# Patient Record
Sex: Female | Born: 1980 | Race: White | Hispanic: No | Marital: Single | Smoking: Current every day smoker
Health system: Southern US, Community
[De-identification: ages and names within clinical notes are randomized; demographics above are authoritative.]

## PROBLEM LIST (undated history)

## (undated) DIAGNOSIS — F419 Anxiety disorder, unspecified: Secondary | ICD-10-CM

## (undated) DIAGNOSIS — F32A Depression, unspecified: Secondary | ICD-10-CM

## (undated) DIAGNOSIS — R87619 Unspecified abnormal cytological findings in specimens from cervix uteri: Secondary | ICD-10-CM

## (undated) DIAGNOSIS — IMO0002 Reserved for concepts with insufficient information to code with codable children: Secondary | ICD-10-CM

## (undated) DIAGNOSIS — F329 Major depressive disorder, single episode, unspecified: Secondary | ICD-10-CM

## (undated) HISTORY — DX: Anxiety disorder, unspecified: F41.9

## (undated) HISTORY — PX: NO PAST SURGERIES: SHX2092

## (undated) HISTORY — DX: Unspecified abnormal cytological findings in specimens from cervix uteri: R87.619

## (undated) HISTORY — PX: TUBAL LIGATION: SHX77

## (undated) HISTORY — DX: Reserved for concepts with insufficient information to code with codable children: IMO0002

## (undated) HISTORY — DX: Depression, unspecified: F32.A

## (undated) HISTORY — DX: Major depressive disorder, single episode, unspecified: F32.9

---

## 2000-10-27 ENCOUNTER — Other Ambulatory Visit: Admission: RE | Admit: 2000-10-27 | Discharge: 2000-10-27 | Payer: Self-pay

## 2001-06-30 ENCOUNTER — Other Ambulatory Visit: Admission: RE | Admit: 2001-06-30 | Discharge: 2001-06-30 | Payer: Self-pay | Admitting: Obstetrics and Gynecology

## 2001-10-27 ENCOUNTER — Observation Stay (HOSPITAL_COMMUNITY): Admission: AD | Admit: 2001-10-27 | Discharge: 2001-10-28 | Payer: Self-pay | Admitting: Obstetrics and Gynecology

## 2001-10-29 ENCOUNTER — Ambulatory Visit (HOSPITAL_COMMUNITY): Admission: AD | Admit: 2001-10-29 | Discharge: 2001-10-29 | Payer: Self-pay | Admitting: Obstetrics and Gynecology

## 2001-12-07 ENCOUNTER — Encounter: Payer: Self-pay | Admitting: *Deleted

## 2001-12-07 ENCOUNTER — Inpatient Hospital Stay (HOSPITAL_COMMUNITY): Admission: AD | Admit: 2001-12-07 | Discharge: 2001-12-11 | Payer: Self-pay | Admitting: *Deleted

## 2002-07-15 ENCOUNTER — Ambulatory Visit (HOSPITAL_COMMUNITY): Admission: RE | Admit: 2002-07-15 | Discharge: 2002-07-15 | Payer: Self-pay | Admitting: Obstetrics and Gynecology

## 2009-04-25 ENCOUNTER — Other Ambulatory Visit: Admission: RE | Admit: 2009-04-25 | Discharge: 2009-04-25 | Payer: Self-pay | Admitting: Obstetrics & Gynecology

## 2010-07-08 ENCOUNTER — Other Ambulatory Visit (HOSPITAL_COMMUNITY)
Admission: RE | Admit: 2010-07-08 | Discharge: 2010-07-08 | Disposition: A | Discharge status: Home / Self Care | Payer: Self-pay | Source: Ambulatory Visit | Attending: Obstetrics & Gynecology | Admitting: Obstetrics & Gynecology

## 2010-07-08 DIAGNOSIS — Z01419 Encounter for gynecological examination (general) (routine) without abnormal findings: Secondary | ICD-10-CM | POA: Insufficient documentation

## 2010-09-27 NOTE — Consult Note (Signed)
[facility]  Patient:    Tina Roth, Tina Roth Visit Number: [id number] MRN: [id number]          Service Type: OBS Location: LDR LDR2 01 Attending Physician:  Tilda Burrow Dictated by:   Christin Bach, M.D. Proc. Date: 10/29/01 Admit Date:  10/29/2001 Discharge Date: 10/29/2001                            Consultation Report  CHIEF COMPLAINT:  Decreased fetal heart rate noted on auscultation in office, history of preterm labor symptoms.  HISTORY OF PRESENT ILLNESS:  This 30 year old female gravida 3, para 2-0-0-2 due January 16, 2002 placing her at 28 weeks 5 days having been seen earlier this week for preterm labor symptoms and discharged on betamethasone 12 mg IM q.24h. x2 was seen in our office with second betamethasone dose.  Routine auscultation showed a notable deceleration to approximately 80 lasting only seconds.  This happened three times according to Chilton Si, evaluating nurse midwife.  She was sent to labor and delivery where fetal monitoring is performed and ultrasound for fluid volume.  NST is performed which shows a reactive NST with only occasional brief dips in the heart rate to approximately 100-110 range not considered representative of frank deceleration, but consistent with normal variability.  Ultrasound is performed to rule out oligohydramnios or nuchal cord.  The infant is found to be a singleton fetus vertex presentation with the head in a hyperextended position with no evidence of nuchal cord.  Amniotic fluid index shows an AFI of 18.2. Fetal movement is noted.  Normal heartbeat is noted.  There are no contractions.  Interestingly, urinalysis now returns positive for esterase, nitrates, and red cells with numerous white cells present.  IMPRESSION: 1. Urinary tract infection. 2. Uterine irritability secondary to urinary tract infection. 3. No evidence of fetal distress at this time.  PLAN:  Prescription given  Macrobid 100 mg b.i.d. x7 days.  Follow-up kick counts advised to patient.  Follow up as per routine schedule in our office in one week. Dictated by:   Christin Bach, M.D. Attending Physician:  Tilda Burrow DD:  10/29/01 TD:  10/31/01 Job: [id number]

## 2010-09-27 NOTE — Op Note (Signed)
NAME:  Tina Roth, BAU                         ACCOUNT NO.:  [id number]   MEDICAL RECORD NO.:  [id number]                   PATIENT TYPE:  AMB   LOCATION:  DAY                                  FACILITY:  [facility]   PHYSICIAN:  Tilda Burrow, M.D.              DATE OF BIRTH:  1981-03-08   DATE OF PROCEDURE:  07/15/2002  DATE OF DISCHARGE:                                 OPERATIVE REPORT   PREOPERATIVE DIAGNOSIS:  Elective sterilization.   POSTOPERATIVE DIAGNOSIS:  Elective sterilization.   PROCEDURE:  Laparoscopic tubal sterilization with Falope rings.   SURGEON:  Christin Bach, M.D.   ASSISTANT:  None.   ANESTHESIA:  General.   COMPLICATIONS:  None.   FINDINGS:  Normal tubes and ovaries.  Moderate pelvic relaxation.  No  adhesions or evident gross physical abnormalities noted.   INDICATION:  Elective permanent sterilization.   DETAILS OF PROCEDURE:  The patient was taken to the operating room, prepped  and  draped for a combined abdominal and vaginal procedure, with Hulka  tenaculum attached to the cervix for uterine  manipulation. Bladder in-and-  out catheterization. An infraumbilical, 1 cm vertical incision, as well as a  transverse suprapubic 1 cm incision. Veress needle was used to introduce  pneumoperitoneum through the umbilical incision with the pneumoperitoneum  easily introduced under 10 mmHg of pressure. Introduction of the Veress  needle was done, carefully elevating the abdominal wall and orienting the  needle toward the pelvis.   The laparoscopic trocar was then carefully introduced into the abdomen using  a similar technique, and the laparoscope was used to visualize normal pelvic  anatomy with no evidence of bleeding or trauma. The suprapubic trocar was  introduced under direct visualization, and then attention was directed to  the left fallopian tube, which was identified up to its fimbriated end,  elevated and a mid-segment loop of the tube was drawn up  into the Falope  ring applier, Marcaine 0.25% applied to the surface of the tube and the  Falope ring applied, inspected, and found to be in satisfactory position.  The opposite tube was then treated in a similar fashion. The mesosalpinx  beneath the Falope ring on each side was then infiltrated with approximately  3 cc of Marcaine 0.25%, using a transabdominal approach with a 22-gauge  spinal needle. Then the laparoscopic equipment was removed after instilling  200 cc of saline into  the abdomen and deflating the abdomen. Subcuticular 4-0 Dexon was used to  close the skin and Steri-Strips were placed on the skin surface. Sponge and  needle counts were correct. The patient tolerated the procedure well, was  awakened, and went to the recovery room in good condition.  Tilda Burrow, M.D.    JVF/MEDQ  D:  07/15/2002  T:  07/16/2002  Job:  [id number]

## 2010-09-27 NOTE — Discharge Summary (Signed)
   NAME:  Tina Roth, Tina Roth                         ACCOUNT NO.:  [id number]   MEDICAL RECORD NO.:  [id number]                   PATIENT TYPE:  INP   LOCATION:  9146                                 FACILITY:  [facility]   PHYSICIAN:  Tilda Burrow, M.D.              DATE OF BIRTH:  22-Feb-1981   DATE OF ADMISSION:  12/07/2001  DATE OF DISCHARGE:  12/11/2001                                 DISCHARGE SUMMARY   ADMISSION DIAGNOSES:  1. Pregnancy 34 weeks.  2. Premature rupture of membranes.   DISCHARGE DIAGNOSES:  1. Pregnancy 34 weeks.  2. Premature rupture of membranes.   PROCEDURE:  In utero transfer of infant to [facility].   HOSPITAL SUMMARY:  This 30 year old female, gravida 3, para 2, AB 0 at 33-[redacted]  weeks gestation was admitted early in the morning of 12/07/01 after  presenting to [facility] at approximately 2 a.m. complaining of  gross rupture of membranes.  Gestational criteria include a last menstrual  period 04/11/01 placing estimated date of confinement of 01/16/02.  By this  criteria she is 34 weeks 2 days.  First trimester ultrasound at 7 weeks on  06/07/01 places her at 33 weeks 1 day; and 20-week ultrasound on 09/03/01  places her at 34 weeks 3 days.  The case was discussed with Dr. Milford Cage, on  call pediatrician who recommends against delivery at our facility.   HOSPITAL COURSE:  The patient was admitted in the early morning hours.  GC  and Chlamydia culture performed.  White count 8900 with 9.8 hemoglobin,  hematocrit 28.2.  Blood type is O positive, group B strep ordered and  subsequently negative.  GC and Chlamydia also negative.  The patient was  kept until 8 a.m. at which time the aforementioned discussion with Dr. Milford Cage  was conducted.   As per his recommendations we proceeded with transfer.  Contact was made  with Dr. Corky Sox at [facility] who accepted the patient in  transfer.  She was transferred at approximately 9 a.m. after 7 hours of  observation.  The patient was transported by EMS ambulance to [facility] where she subsequently delivered and did well.                                               Tilda Burrow, M.D.    JVF/MEDQ  D:  12/15/2001  T:  12/19/2001  Job:  [id number]

## 2010-09-27 NOTE — H&P (Signed)
NAME:  Tina Roth, Tina Roth NO.:  [id number]   MEDICAL RECORD NO.:  [id number]                   PATIENT TYPE:  AMB   LOCATION:                                       FACILITY:  [facility]   PHYSICIAN:  Tilda Burrow, M.D.              DATE OF BIRTH:  Jul 25, 1980   DATE OF ADMISSION:  07/15/2002  DATE OF DISCHARGE:                                HISTORY & PHYSICAL   ADMISSION DIAGNOSIS:  Desire for elective permanent sterilization.   HISTORY OF PRESENT ILLNESS:  This is a 30 year old female, gravida 3, para  2, now six months since delivery of her last infant.  She is admitted for  elective permanent sterilization.  She had counseling in our office and  signed sterilization forms earlier on 05/30/2002.  The patient was seen in  our office today again and again affirmed her desire for permanent  sterilization.  She has three children, two by current partner and is  strongly desirous of proceeding with permanent sterilization.  She discussed  this with her husband, and since she is the one that feels strongest about  permanent sterilization, she is assuming responsibility for permanent  sterilization.   I once again reviewed the procedure in its entirety, emphasized to her that  it is intended as a permanent procedure, that no insurance companies will  pay for tubal reversal, and the cost of reversal of the procedure is quite  extensive, $10,000 or more.  She understands this and acknowledges that she  has no desire for future childbearing.   PAST MEDICAL HISTORY:  Benign.   PAST SURGICAL HISTORY:  Negative.   ALLERGIES:  None.   SOCIAL HISTORY:  Married, homemaker.   PAST OBSTETRICAL HISTORY:  Two vaginal deliveries at [facility] and  then preterm rupture of membranes without labor at 34 weeks with recent  delivery in August 2003 when she delivered in uncomplicated fashion and has  done well since that time.   PHYSICAL EXAMINATION:  VITAL  SIGNS:  Height 5 feet 7 inches, weight 217.  Blood pressure 120/75.  GENERAL:  Healthy, large-frame, Caucasian female, alert and oriented x e.  HEENT:  Pupils are equal, round, and reactive.  Extraocular movements  intact.  NECK:  Supple.  Trachea midline.  BREASTS:  Exam deferred.  LUNGS:  Clear to auscultation.  HEART:  Regular rate and rhythm without murmurs.  ABDOMEN:  Lax abdomen without masses.  PELVIS:  Normal external genitalia.  Vaginal exam shows normal secretions.  Cervix is not purulent, uterus anteflexed, adnexa negative for masses.    ASSESSMENT:  Desire for elective permanent sterilization.   PLAN:  Laparoscopic tubal sterilization, Falope rings, on 07/15/2002.  Tilda Burrow, M.D.    JVF/MEDQ  D:  07/05/2002  T:  07/05/2002  Job:  [id number]

## 2010-09-27 NOTE — H&P (Signed)
   NAME:  Tina Roth, Tina Roth NO.:  [id number]   MEDICAL RECORD NO.:  [id number]                   PATIENT TYPE:  AMP   LOCATION:                                       FACILITY:  [facility]   PHYSICIAN:  Tilda Burrow, M.D.              DATE OF BIRTH:  [birth year]   DATE OF ADMISSION:  07/15/2002  DATE OF DISCHARGE:                                HISTORY & PHYSICAL   PREOPERATIVE DIAGNOSIS:  Elective permanent sterilization.   HISTORY OF PRESENT ILLNESS:  The patient is a 30 year old female gravida 3,  para 3, now 6 months since her most recent vaginal delivery having reached  30 years of age in December.  She came in in January, signed tubal  sterilization forms, was informed of our desire for people to wait longer  than 30 years of age to make this decision but we are relenting to her very  strong, clear, calm discussion of a desire for permanent sterilization.  Three children is all she wants and she states that under no circumstance  even with change-of-life partner, etc., that she would change her desire for  childbearing.  Permanency of the requested procedure is emphasized and the  fact that no reversals are paid for standard insurances such as Medicaid.  Medicaid consent form was signed 05/30/2002.   PAST MEDICAL HISTORY:  Benign.   SURGICAL HISTORY:  Negative.   ALLERGIES:  None.   SOCIAL HISTORY:  Married homemaker.   PHYSICAL EXAMINATION:  GENERAL:  Healthy Caucasian female.  VITAL SIGNS:  Weight 217, blood pressure 128/75, height 5 feet 7 inches.  HEENT:  Pupils equal, round and reactive to light.  Extraocular movements  intact.  NECK:  Supple; trachea midline.  CHEST:  Clear to auscultation.  ABDOMEN:  Nontender.  EXTERNAL GENITALIA:  Multiparous cervix, well-healed normal multiparous  appearance; adnexa negative for masses; uterus anterior, normal size, shape,  and contour.   ASSESSMENT:  Desire for elective permanent  sterilization.                                               Tilda Burrow, M.D.    JVF/MEDQ  D:  07/14/2002  T:  07/14/2002  Job:  [id number]

## 2012-09-17 ENCOUNTER — Ambulatory Visit (INDEPENDENT_AMBULATORY_CARE_PROVIDER_SITE_OTHER): Payer: BC Managed Care – PPO | Admitting: Family Medicine

## 2012-09-17 ENCOUNTER — Encounter: Payer: Self-pay | Admitting: Family Medicine

## 2012-09-17 VITALS — BP 128/78 | Temp 98.6°F | Wt 217.0 lb

## 2012-09-17 DIAGNOSIS — A6920 Lyme disease, unspecified: Secondary | ICD-10-CM

## 2012-09-17 MED ORDER — AMOXICILLIN 500 MG PO TABS: ORAL_TABLET | ORAL | Status: AC

## 2012-09-17 NOTE — Progress Notes (Signed)
  Subjective:    Patient ID: Tina Roth, female    DOB: Jun 30, 1980, 32 y.o.   MRN: [id number]  HPI  felt achey, chill for a couple weeks. achey in wrists. Pins and needes around hands. Also around lips. Tick bites several weeks ago. One very small. Unknown duration. No rashed. Working full time.no hx of numbness in hands. achey in joints--hurts to move. Dec energy.   No rash elsewhere. No fever. No headache. Achiness in the joints is new. Also the tingling around the hands is new. Review of Systems Patient has had a lot of stress lately. See prior notes. Now on medicine for this. No suicidal or homicidal thoughts.   ROS otherwise negative. Objective:   Physical Exam  Alert no acute distress. HEENT normal. Lungs clear. Heart regular rate and rhythm. Sensation intact. No rash.      Assessment & Plan:  Impression post tick symptomatology. Poor Reliability of Lyme testing discussed with patient. Plan empirical treatment for early stage I disease rationale discussed. Warning signs discussed. WSL

## 2012-09-22 ENCOUNTER — Telehealth: Payer: Self-pay | Admitting: Nurse Practitioner

## 2012-09-22 NOTE — Telephone Encounter (Signed)
Pt wants to know if she can have a work note for this weekend.. 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 She is trying to get some help an get detoxed from these pain pills she has been taking.  She is in danger of loosing her job and wants to know if you can call her.  I could not get her into your schedule for tomorrow.

## 2012-09-22 NOTE — Telephone Encounter (Signed)
w e ok

## 2012-09-22 NOTE — Telephone Encounter (Signed)
WE written up front for Pt to PU

## 2012-09-23 ENCOUNTER — Encounter: Payer: Self-pay | Admitting: Family Medicine

## 2012-09-23 ENCOUNTER — Ambulatory Visit (INDEPENDENT_AMBULATORY_CARE_PROVIDER_SITE_OTHER): Payer: BC Managed Care – PPO | Admitting: Family Medicine

## 2012-09-23 VITALS — BP 152/98 | HR 80 | Ht 68.0 in | Wt 224.2 lb

## 2012-09-23 DIAGNOSIS — G4700 Insomnia, unspecified: Secondary | ICD-10-CM

## 2012-09-23 DIAGNOSIS — F411 Generalized anxiety disorder: Secondary | ICD-10-CM

## 2012-09-23 NOTE — Progress Notes (Signed)
  Subjective:    Patient ID: Tina Roth, female    DOB: [birth year], 32 y.o.   MRN: [id number]  HPI Has been taking husbands oxycont 15.--thirty or forty the past few wks. Not much the last wk-- a couple hycrocodones. Now having withdrawal symptoms, achey and joint aches. Contacted workplace mental health person.they rec nor work this weekend no suicid thoughts. No active thoughts of it Patient notes ongoing anxiety. Ongoing feelings of depression. No suicidal or homicidal thoughts. Frustrated about her work. She no longer has access to her husband's narcotics. Has it taken only 2 pain pills in the last several days. Notes achiness in the joints and muscles. Otherwise doing okay. She realizes she has a significant problem. Patient is very frustrated when she called yesterday we could not immediately see her at that time.  Review of Systems ROS otherwise negative.    Objective:   Physical Exam  Alert no acute distress. Lungs clear. Heart regular in rhythm. HEENT normal. Mental status somewhat anxious. Somewhat frustrated. Not a particular depressed affect. Oriented x3.      Assessment & Plan:  Impression #1 narcotic drug abuse. Patient states she will obtain her drug counseling through the workplace mental health support. #2 chronic anxiety with element of depression. Still significant. Patient feels with narcotics she was self-medicating her  situation. States that the twice per day Xanax as far to week for her. Plan increase Xanax to 0.5 3 times a day. Patient to use the rest of her current prescription which she just got a few days ago, and then call us towards the end of this 1. Drug counseling through work place. We will work on a psychiatry referral. Rationale discussed. WSL 25 minutes spent most in discussion

## 2012-09-29 ENCOUNTER — Telehealth: Payer: Self-pay | Admitting: Family Medicine

## 2012-09-29 NOTE — Telephone Encounter (Signed)
See 09/24/12 office visit. Patient has drug addiction problem.

## 2012-09-29 NOTE — Telephone Encounter (Signed)
Pt still feels she is not ready to go back to work at this time due to her illegal prescription use. She wants to know if she can get a work excuse and have FMLA papers filled out until she feel she is ready to return to work . She has tried to get her appts with the Psychiatrist and can't be seen till next month.  Would like to be wrote out of work until the 16th of June when her first appt is kept with the psychiatrist. Please call pt [phone].

## 2012-09-29 NOTE — Telephone Encounter (Signed)
Work note through end of next week then Dr.Steve to decide.

## 2012-09-29 NOTE — Telephone Encounter (Signed)
Left message on voicemail to return call.

## 2012-09-30 ENCOUNTER — Encounter: Payer: Self-pay | Admitting: Family Medicine

## 2012-09-30 NOTE — Telephone Encounter (Signed)
Please do work note through end of next week, must follow up with Dr. Brett Canales

## 2012-09-30 NOTE — Telephone Encounter (Signed)
Pt to pick up work note

## 2012-10-05 ENCOUNTER — Encounter: Payer: Self-pay | Admitting: *Deleted

## 2012-10-06 ENCOUNTER — Other Ambulatory Visit (HOSPITAL_COMMUNITY)
Admission: RE | Admit: 2012-10-06 | Discharge: 2012-10-06 | Disposition: A | Discharge status: Home / Self Care | Payer: Self-pay | Source: Ambulatory Visit | Attending: Adult Health | Admitting: Adult Health

## 2012-10-06 ENCOUNTER — Ambulatory Visit (INDEPENDENT_AMBULATORY_CARE_PROVIDER_SITE_OTHER): Payer: BC Managed Care – PPO | Admitting: Family Medicine

## 2012-10-06 ENCOUNTER — Encounter: Payer: Self-pay | Admitting: Adult Health

## 2012-10-06 ENCOUNTER — Ambulatory Visit (INDEPENDENT_AMBULATORY_CARE_PROVIDER_SITE_OTHER): Payer: BC Managed Care – PPO | Admitting: Adult Health

## 2012-10-06 ENCOUNTER — Encounter: Payer: Self-pay | Admitting: Family Medicine

## 2012-10-06 VITALS — BP 136/88 | Wt 224.4 lb

## 2012-10-06 VITALS — BP 114/72 | HR 76 | Ht 67.0 in | Wt 223.0 lb

## 2012-10-06 DIAGNOSIS — F341 Dysthymic disorder: Secondary | ICD-10-CM

## 2012-10-06 DIAGNOSIS — M62838 Other muscle spasm: Secondary | ICD-10-CM

## 2012-10-06 DIAGNOSIS — Z01419 Encounter for gynecological examination (general) (routine) without abnormal findings: Secondary | ICD-10-CM | POA: Insufficient documentation

## 2012-10-06 DIAGNOSIS — Z1151 Encounter for screening for human papillomavirus (HPV): Secondary | ICD-10-CM | POA: Insufficient documentation

## 2012-10-06 DIAGNOSIS — F419 Anxiety disorder, unspecified: Secondary | ICD-10-CM | POA: Insufficient documentation

## 2012-10-06 DIAGNOSIS — N920 Excessive and frequent menstruation with regular cycle: Secondary | ICD-10-CM | POA: Insufficient documentation

## 2012-10-06 DIAGNOSIS — F329 Major depressive disorder, single episode, unspecified: Secondary | ICD-10-CM

## 2012-10-06 DIAGNOSIS — Z113 Encounter for screening for infections with a predominantly sexual mode of transmission: Secondary | ICD-10-CM | POA: Insufficient documentation

## 2012-10-06 DIAGNOSIS — N946 Dysmenorrhea, unspecified: Secondary | ICD-10-CM

## 2012-10-06 DIAGNOSIS — F1910 Other psychoactive substance abuse, uncomplicated: Secondary | ICD-10-CM

## 2012-10-06 NOTE — Patient Instructions (Addendum)
Review endo ablation handout and call to let me know what she decides Physical in 1 year Take motrin or advil to see if helps

## 2012-10-06 NOTE — Progress Notes (Signed)
  Subjective:    Patient ID: Tina Roth, female    DOB: 1981/02/14, 32 y.o.   MRN: [id number]  HPI Due to see a psychiatrist within the next 2 weeks. Notes to 3 times a day Xanax does not helping as much as she would hope. Claims no suicidal or homicidal thoughts. Took one narcotic pill in the interim. Still having withdrawal symptoms. Doesn't want work to find out. N A in [place] or [place].still out of work, ready to go back. Last note til 2nd. Muscle cramps. Xanax due to run out.no time to exercise. fa had stroke. Review of Systems    ROS otherwise negative. Objective:   Physical Exam  Alert no acute distress. Lungs clear. Heart regular in rhythm. Neck supple. Abdomen benign.      Assessment & Plan:  Impression 1 drug withdrawal symptoms with muscle spasm and headache discuss. #2 anxiety suboptimum control. #3 depression stable. #4 drug abuse. Plan due to see a psychiatrist soon. Increase he presently him to 4 times a day #60 rationale discussed symptomatic care discussed. Followup with psychiatrist within 2 weeks as scheduled. WSL exercise encouraged

## 2012-10-06 NOTE — Progress Notes (Signed)
Patient ID: Tina Roth, female   DOB: [birth year], 32 y.o.   MRN: [id number] History of Present Illness: Tina Roth is a 32 year old white female,separated, in for pap and physical, and wants to be checked for GC/CHL, her ex cheated.   Current Medications, Allergies, Past Medical History, Past Surgical History, Family History and Social History were reviewed in [employer] record.     Review of Systems: Patient denies any headaches, blurred vision, shortness of breath, chest pain, abdominal pain, problems with bowel movements, urination, or intercourse. No joint pain, zoloft helps with moods.Complains of cramps with periods, periods last 4-6 days and heavy first two days.   Physical Exam:Blood pressure 114/72, pulse 76, height 5\' 7"  (1.702 m), weight 223 lb (101.152 kg), last menstrual period 10/01/2012. General:  Well developed, well nourished, no acute distress Skin:  Warm and dry,tan Neck:  Midline trachea, normal thyroid Lungs; Clear to auscultation bilaterally Breast:  No dominant palpable mass, retraction, or nipple discharge Cardiovascular: Regular rate and rhythm Abdomen:  Soft, non tender, no hepatosplenomegaly Pelvic:  External genitalia is normal in appearance.  The vagina is normal in appearance.The cervix is bulbous. Pap performed with HPV and GC/CHL. Uterus is felt to be normal size, shape, and contour.  No adnexal masses or tenderness noted. Extremities:  No swelling or varicosities noted Psych:  Alert and cooperative,   Impression: Yearly gyn exam STD testing Menorrhagia Dysmenorrhea  Plan: Physical in 1 year Review endo ablation handout Try Motrin 800 mg 1 every 8 hours

## 2012-10-10 DIAGNOSIS — F1910 Other psychoactive substance abuse, uncomplicated: Secondary | ICD-10-CM | POA: Insufficient documentation

## 2012-10-13 ENCOUNTER — Ambulatory Visit (INDEPENDENT_AMBULATORY_CARE_PROVIDER_SITE_OTHER): Payer: BC Managed Care – PPO | Admitting: Family Medicine

## 2012-10-13 ENCOUNTER — Encounter: Payer: Self-pay | Admitting: Family Medicine

## 2012-10-13 VITALS — BP 122/80 | Temp 98.0°F | Wt 235.0 lb

## 2012-10-13 DIAGNOSIS — M545 Low back pain: Secondary | ICD-10-CM

## 2012-10-13 DIAGNOSIS — M546 Pain in thoracic spine: Secondary | ICD-10-CM

## 2012-10-13 NOTE — Progress Notes (Signed)
  Subjective:    Patient ID: Tina Roth, female    DOB: [birth year], 32 y.o.   MRN: [id number]  HPI At hospital last night. A lot of stress with sick friend. Had a  Couple hours of sleep. Was drug tested today due to lethargy. Took zoloft and a xanax after lunch.  Unfortunately, the patient states she is took a couple of Vicodin from a friend yesterday. Please see prior notes. Patient has had difficulty with recent narcotic abuse. She is due to see a psychiatrist very soon for this along with anxiety and depression.  After a difficult night with little sleep she arrived with her job at [employer] office somewhat groggy. The force her to do a drug test. She is very concerned that it will show the Xanax and I prescribed her, along with the hydrocodone that she took from a friend. Of note patient has been having back spasms lately. We prescribed her chlorzoxazone. She states she took the Vicodin for the back pain  Review of Systems    ROS otherwise negative Objective:   Physical Exam  Alert no acute distress. HEENT normal. Lungs clear. Heart regular in rhythm. Abdomen benign. Neuro intact. Somewhat tearful at times. Claims no suicidal thoughts      Assessment & Plan:  Impression #1 drug abuse ongoing. I've advised patient she needs to seriously pursue every path that she can to get away from her difficulty with narcotics #2 anxiety controlled fair at this time. #3 depression ongoing but stable. #4 muscle spasms ongoing. Plan 25 minutes spent most in discussion. I will write a letter to describe her situation that she can present to her work place which may be of some help. Followup as psychiatrist as scheduled. WSL

## 2012-10-20 ENCOUNTER — Ambulatory Visit (INDEPENDENT_AMBULATORY_CARE_PROVIDER_SITE_OTHER): Payer: BC Managed Care – PPO | Admitting: Psychiatry

## 2012-10-20 ENCOUNTER — Encounter (HOSPITAL_COMMUNITY): Payer: Self-pay | Admitting: Psychiatry

## 2012-10-20 DIAGNOSIS — F329 Major depressive disorder, single episode, unspecified: Secondary | ICD-10-CM

## 2012-10-20 DIAGNOSIS — F3289 Other specified depressive episodes: Secondary | ICD-10-CM

## 2012-10-20 DIAGNOSIS — F411 Generalized anxiety disorder: Secondary | ICD-10-CM

## 2012-10-20 DIAGNOSIS — F419 Anxiety disorder, unspecified: Secondary | ICD-10-CM

## 2012-10-21 ENCOUNTER — Other Ambulatory Visit: Payer: Self-pay | Admitting: *Deleted

## 2012-10-21 MED ORDER — SERTRALINE HCL 100 MG PO TABS: 150.0000 mg | ORAL_TABLET | Freq: Every day | ORAL | Status: DC

## 2012-10-25 NOTE — Patient Instructions (Signed)
Discussed orally 
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
## 2012-10-26 ENCOUNTER — Ambulatory Visit (INDEPENDENT_AMBULATORY_CARE_PROVIDER_SITE_OTHER): Payer: BC Managed Care – PPO | Admitting: Psychiatry

## 2012-10-26 ENCOUNTER — Encounter (HOSPITAL_COMMUNITY): Payer: Self-pay | Admitting: Psychiatry

## 2012-10-26 ENCOUNTER — Telehealth (HOSPITAL_COMMUNITY): Payer: Self-pay | Admitting: Psychiatry

## 2012-10-26 VITALS — BP 113/71 | HR 68 | Ht 67.0 in | Wt 216.6 lb

## 2012-10-26 DIAGNOSIS — F419 Anxiety disorder, unspecified: Secondary | ICD-10-CM

## 2012-10-26 DIAGNOSIS — F411 Generalized anxiety disorder: Secondary | ICD-10-CM

## 2012-10-26 DIAGNOSIS — F605 Obsessive-compulsive personality disorder: Secondary | ICD-10-CM

## 2012-10-26 DIAGNOSIS — F1910 Other psychoactive substance abuse, uncomplicated: Secondary | ICD-10-CM

## 2012-10-26 DIAGNOSIS — F1994 Other psychoactive substance use, unspecified with psychoactive substance-induced mood disorder: Secondary | ICD-10-CM

## 2012-10-26 DIAGNOSIS — F341 Dysthymic disorder: Secondary | ICD-10-CM

## 2012-10-26 MED ORDER — BUSPIRONE HCL 10 MG PO TABS: 5.0000 mg | ORAL_TABLET | Freq: Three times a day (TID) | ORAL | Status: DC

## 2012-10-26 MED ORDER — PROPRANOLOL HCL 10 MG PO TABS: 10.0000 mg | ORAL_TABLET | Freq: Three times a day (TID) | ORAL | Status: DC

## 2012-10-26 MED ORDER — GABAPENTIN 100 MG PO CAPS: 100.0000 mg | ORAL_CAPSULE | Freq: Three times a day (TID) | ORAL | Status: DC

## 2012-10-26 MED ORDER — SERTRALINE HCL 100 MG PO TABS: 150.0000 mg | ORAL_TABLET | Freq: Every day | ORAL | Status: DC
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
## 2012-10-26 NOTE — Telephone Encounter (Signed)
Script resent

## 2012-10-26 NOTE — Patient Instructions (Addendum)
Regularly attend some type of substance abuse 12 Step meetings, get a sponsor and WORK the steps.  In the next 6 months start regularly attending [facility]. Strongly consider attending at least 6 [facility] Meetings to help you learn about how your helping others to the exclusion of helping yourself is actually hurting yourself and is actually an addiction to fixing others and that you need to work the 12 Step to Happiness through the [employer]. Al-Anon Family Groups could be helpful with how to deal with substance abusing family and friends. Or your own issues of being in victim role.  There are only 40 [facility]on Family Group meetings a week here in [place].  Online are current listing of those meetings @ [place].[email or web address]/meetings.html  There are [employer].  Search on line and there you can learn the format and can access the schedule for yourself.  Their number is [phone]  Set a timer for 8 or a certain number minutes and walk for that amount of time in the house or in the yard.  Mark the number of minutes on a calendar for that day.  Do that every day this week.  Then next week increase the time by 1 minutes and then mark the calendar with the number of minutes for that day.  Each week increase your exercise by one minute.  Keep a record of this so you can see the progress you are making.  Do this every day, just like eating and sleeping.  It is good for pain control, depression, and for your soul/spirit.  Bring the record in for your next visit so we can talk about your effort and how you feel with the new exercise program going and working for you.  Relaxation is the ultimate solution for you.  You can seek it through tub baths, bubble baths, essential oils or incense, walking or chatting with friends, listening to soft music, watching a candle burn and just letting all thoughts go and appreciating the true essence of the Creator.  Pets or animals may be very helpful.  You  might spend some time with them and then go do more directed meditation.  "I am Wishes Fulfilled Meditation" by Marylene Buerger and Lyndal Pulley may be helpful MUSIC for getting to sleep or for meditating You can order it from on line.  You might find the Chill channel on Pandora and explore the artists that you like better.   Try Inderal for the anticipated moments that will be anxious.  Take it 90 minutes before the anxious time.  Call if problems or concerns.  Have Dr Kennis Carina consider adding Baclofen for back spasms and anxiety.

## 2012-10-29 ENCOUNTER — Ambulatory Visit (HOSPITAL_COMMUNITY): Payer: Self-pay | Admitting: Psychiatry

## 2012-11-23 ENCOUNTER — Ambulatory Visit (HOSPITAL_COMMUNITY): Payer: Self-pay | Admitting: Psychiatry

## 2012-11-25 ENCOUNTER — Ambulatory Visit (HOSPITAL_COMMUNITY): Payer: Self-pay | Admitting: Psychiatry

## 2012-12-04 ENCOUNTER — Emergency Department (HOSPITAL_COMMUNITY)
Admission: EM | Admit: 2012-12-04 | Discharge: 2012-12-04 | Disposition: A | Discharge status: Home / Self Care | Payer: Self-pay | Attending: Emergency Medicine | Admitting: Emergency Medicine

## 2012-12-04 ENCOUNTER — Encounter (HOSPITAL_COMMUNITY): Payer: Self-pay

## 2012-12-04 DIAGNOSIS — S6992XA Unspecified injury of left wrist, hand and finger(s), initial encounter: Secondary | ICD-10-CM

## 2012-12-04 DIAGNOSIS — Z23 Encounter for immunization: Secondary | ICD-10-CM | POA: Insufficient documentation

## 2012-12-04 DIAGNOSIS — F17200 Nicotine dependence, unspecified, uncomplicated: Secondary | ICD-10-CM | POA: Insufficient documentation

## 2012-12-04 DIAGNOSIS — S60459A Superficial foreign body of unspecified finger, initial encounter: Secondary | ICD-10-CM | POA: Insufficient documentation

## 2012-12-04 DIAGNOSIS — Y93E5 Activity, floor mopping and cleaning: Secondary | ICD-10-CM | POA: Insufficient documentation

## 2012-12-04 DIAGNOSIS — W268XXA Contact with other sharp object(s), not elsewhere classified, initial encounter: Secondary | ICD-10-CM | POA: Insufficient documentation

## 2012-12-04 DIAGNOSIS — Z79899 Other long term (current) drug therapy: Secondary | ICD-10-CM | POA: Insufficient documentation

## 2012-12-04 DIAGNOSIS — F411 Generalized anxiety disorder: Secondary | ICD-10-CM | POA: Insufficient documentation

## 2012-12-04 DIAGNOSIS — F3289 Other specified depressive episodes: Secondary | ICD-10-CM | POA: Insufficient documentation

## 2012-12-04 DIAGNOSIS — Y929 Unspecified place or not applicable: Secondary | ICD-10-CM | POA: Insufficient documentation

## 2012-12-04 DIAGNOSIS — F329 Major depressive disorder, single episode, unspecified: Secondary | ICD-10-CM | POA: Insufficient documentation

## 2012-12-04 MED ORDER — TETANUS-DIPHTH-ACELL PERTUSSIS 5-2.5-18.5 LF-MCG/0.5 IM SUSP
0.5000 mL | Freq: Once | INTRAMUSCULAR | Status: AC
  Administered 2012-12-04: 0.5 mL via INTRAMUSCULAR
  Filled 2012-12-04: qty 0.5

## 2012-12-04 MED ORDER — BACITRACIN ZINC 500 UNIT/GM EX OINT
TOPICAL_OINTMENT | CUTANEOUS | Status: AC
  Filled 2012-12-04: qty 0.9

## 2012-12-04 MED ORDER — LIDOCAINE HCL (PF) 1 % IJ SOLN
30.0000 mL | Freq: Once | INTRAMUSCULAR | Status: DC
  Filled 2012-12-04: qty 30

## 2012-12-04 MED ORDER — LIDOCAINE HCL (PF) 1 % IJ SOLN
INTRAMUSCULAR | Status: AC
  Filled 2012-12-04: qty 5

## 2012-12-04 NOTE — ED Notes (Signed)
Fish hook in left 5th finger.
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
## 2012-12-04 NOTE — ED Notes (Signed)
Pt presents with fishing hook in her right index finger. Pt states she accidentally grabbed the hook while cleaning.

## 2013-03-17 ENCOUNTER — Other Ambulatory Visit: Payer: Self-pay

## 2013-09-28 ENCOUNTER — Ambulatory Visit (INDEPENDENT_AMBULATORY_CARE_PROVIDER_SITE_OTHER): Payer: Medicaid Other | Admitting: Nurse Practitioner

## 2013-09-28 ENCOUNTER — Encounter: Payer: Self-pay | Admitting: Nurse Practitioner

## 2013-09-28 VITALS — BP 130/78 | Temp 100.2°F | Ht 67.5 in | Wt 218.0 lb

## 2013-09-28 DIAGNOSIS — J020 Streptococcal pharyngitis: Secondary | ICD-10-CM

## 2013-09-28 LAB — POCT RAPID STREP A (OFFICE): RAPID STREP A SCREEN: POSITIVE — AB

## 2013-09-28 MED ORDER — AZITHROMYCIN 250 MG PO TABS: ORAL_TABLET | ORAL | Status: DC

## 2013-09-30 ENCOUNTER — Encounter: Payer: Self-pay | Admitting: Nurse Practitioner

## 2013-09-30 NOTE — Progress Notes (Signed)
Subjective:  Presents complaints of fever and sore throat for the past 3 days. Headache. Nausea but no vomiting. Minimal cough or head congestion. Slight ear pain. No rash. Taking fluids well. Voiding normal limit. Generalized fatigue and body aches.  Objective:   BP 130/78  Temp(Src) 100.2 F (37.9 C) (Oral)  Ht 5' 7.5" (1.715 m)  Wt 218 lb (98.884 kg)  BMI 33.62 kg/m2 NAD. Alert, oriented. TMs mild clear effusion, no erythema. Pharynx erythematous with palatal petechiae. RST positive. Neck supple with mild soft adenopathy. Lungs clear. Heart regular rate rhythm.  Assessment:Strep pharyngitis - Plan: POCT rapid strep A  Plan: Meds ordered this encounter  Medications  . azithromycin (ZITHROMAX Z-PAK) 250 MG tablet    Sig: Take 2 tablets (500 mg) on  Day 1,  followed by 1 tablet (250 mg) once daily on Days 2 through 5.    Dispense:  6 each    Refill:  0    Order Specific Question:  Supervising Provider    Answer:  Merlyn Albert [[id number]]   Reviewed symptomatic care and warning signs. Call back in 48 hours if no improvement, sooner if worse.

## 2014-03-13 ENCOUNTER — Encounter: Payer: Self-pay | Admitting: Nurse Practitioner

## 2016-10-21 ENCOUNTER — Ambulatory Visit (INDEPENDENT_AMBULATORY_CARE_PROVIDER_SITE_OTHER): Payer: BLUE CROSS/BLUE SHIELD | Admitting: Family Medicine

## 2016-10-21 ENCOUNTER — Encounter: Payer: Self-pay | Admitting: Family Medicine

## 2016-10-21 VITALS — BP 124/88 | Temp 98.5°F | Ht 67.5 in | Wt 251.0 lb

## 2016-10-21 DIAGNOSIS — R079 Chest pain, unspecified: Secondary | ICD-10-CM

## 2016-10-21 NOTE — Progress Notes (Signed)
   Subjective:    Patient ID: Tina Roth, female    DOB: [birth year], 36 y.o.   MRN: [id number]  HPIFollow up ER visit for chest pain and high BP.   Felt bad for a coule days   Substernal pressure  Pos smoking  Smokes a few not many   Took blood gave pt nitro , asa , ran ekg looked normal,  BP was pretty high, 175 or 70  States overall feeling better.  All notes from the hospital reviewed at length along with EKG results blood work results chest x-ray etc.  Review of Systems No headache, no major weight loss or weight gain, no chest pain no back pain abdominal pain no change in bowel habits complete ROS otherwise negative     Objective:   Physical Exam  Alert and oriented, vitals reviewed and stable, NAD ENT-TM's and ext canals WNL bilat via otoscopic exam Soft palate, tonsils and post pharynx WNL via oropharyngeal exam Neck-symmetric, no masses; thyroid nonpalpable and nontender Pulmonary-no tachypnea or accessory muscle use; Clear without wheezes via auscultation Card--no abnrml murmurs, rhythm reg and rate WNL Carotid pulses symmetric, without bruits       Assessment & Plan:  Impression nonspecific chest pain. Highly doubt cardiac origin. However strong family history of coronary artery disease. And substantial anxiety on part of family and patient for that matter. Symptom care for now. Cardiology referral to completely rule out slight chance of or serious etiology.  Greater than 50% of this 25 minute face to face visit was spent in counseling and discussion and coordination of care regarding the above diagnosis/diagnosies

## 2016-10-24 ENCOUNTER — Encounter: Payer: Self-pay | Admitting: Family Medicine

## 2016-11-05 ENCOUNTER — Encounter: Payer: Self-pay | Admitting: *Deleted

## 2016-11-05 NOTE — Progress Notes (Signed)
Cardiology Office Note   Date:  11/06/2016   ID:  Tina Roth, DOB Dec 27, 1980, MRN [id number]  PCP:  Merlyn Albert, MD  Cardiologist:   Charlton Haws, MD   No chief complaint on file.     History of Present Illness: Tina Roth is a 36 y.o. female who presents for consultation regarding chest pain Referred by Dr Gerda Diss  Reviewed his note from 10/21/16 He indicates she was seen in ER at [facility].   SSCP CRF;s smoking R/O ECG not acute CXR NAD BP was high with systolic 175 mmHg She had felt poorly for 3 days prior BP better now No chest pain does have some exertional dyspnea    Past Medical History:  Diagnosis Date  . Abnormal Pap smear   . Anxiety   . Depression     Past Surgical History:  Procedure Laterality Date  . NO PAST SURGERIES    . TUBAL LIGATION       No current outpatient prescriptions on file.   No current facility-administered medications for this visit.     Allergies:   Patient has no known allergies.    Social History:  The patient  reports that she has been smoking Cigarettes.  She has been smoking about 1.00 pack per day. She has never used smokeless tobacco. She reports that she does not drink alcohol or use drugs.   Family History:  The patient's family history includes ADD / ADHD in her sister; Alcohol abuse in her father; Anxiety disorder in her father; Diabetes in her paternal grandfather; Heart disease in her father; Hyperlipidemia in her paternal grandfather; Hypertension in her father; OCD in her son; Sexual abuse in her sister; Stroke in her father and paternal grandfather.    ROS:  Please see the history of present illness.   Otherwise, review of systems are positive for none.   All other systems are reviewed and negative.    PHYSICAL EXAM: VS:  There were no vitals taken for this visit. , BMI There is no height or weight on file to calculate BMI. Affect appropriate Healthy:  appears stated age HEENT: normal Neck supple  with no adenopathy JVP normal no bruits no thyromegaly Lungs clear with no wheezing and good diaphragmatic motion Heart:  S1/S2 no murmur, no rub, gallop or click PMI normal Abdomen: benighn, BS positve, no tenderness, no AAA no bruit.  No HSM or HJR Distal pulses intact with no bruits No edema Neuro non-focal Skin warm and dry No muscular weakness    EKG:  10/20/16  SR rate 67 RSR' otherwise normal    Recent Labs: No results found for requested labs within last 8760 hours.    Lipid Panel No results found for: CHOL, TRIG, HDL, CHOLHDL, VLDL, LDLCALC, LDLDIRECT    Wt Readings from Last 3 Encounters:  10/21/16 113.9 kg (251 lb)  09/28/13 98.9 kg (218 lb)  12/04/12 90.7 kg (200 lb)      Other studies Reviewed: Additional studies/ records that were reviewed today include: Notes from Dr Gerda Diss and ECG CXR [facility].    ASSESSMENT AND PLAN:  1.  Chest Pain atypical normal ECG f/u ETT 2. Smoking counseled on cessation limited motivation to quit    Current medicines are reviewed at length with the patient today.  The patient does not have concerns regarding medicines.  The following changes have been made:  no change  Labs/ tests ordered today include: Echo and ETT No orders of  the defined types were placed in this encounter.    Disposition:   FU with cardiology PRN      Signed, Charlton HawsPeter Jaman Aro, MD  11/06/2016 3:49 PM    [facility] [address], [place], [place]  [address] Phone: [phone]) [phone]; Fax: [phone]) [phone]

## 2016-11-06 ENCOUNTER — Ambulatory Visit (INDEPENDENT_AMBULATORY_CARE_PROVIDER_SITE_OTHER): Payer: Self-pay | Admitting: Cardiovascular Disease

## 2016-11-06 ENCOUNTER — Encounter: Payer: Self-pay | Admitting: Cardiovascular Disease

## 2016-11-06 VITALS — BP 142/93 | HR 95 | Ht 68.0 in | Wt 253.0 lb

## 2016-11-06 DIAGNOSIS — R079 Chest pain, unspecified: Secondary | ICD-10-CM

## 2016-11-06 DIAGNOSIS — I309 Acute pericarditis, unspecified: Secondary | ICD-10-CM

## 2016-11-06 NOTE — Patient Instructions (Signed)
Your physician recommends that you schedule a follow-up appointment in:  As needed   Your physician recommends that you continue on your current medications as directed. Please refer to the Current Medication list given to you today.    Your physician has requested that you have an exercise tolerance test. For further information please visit [email or web address]. Please also follow instruction sheet, as given.     Your physician has requested that you have an echocardiogram. Echocardiography is a painless test that uses sound waves to create images of your heart. It provides your doctor with information about the size and shape of your heart and how well your heart's chambers and valves are working. This procedure takes approximately one hour. There are no restrictions for this procedure.       Thank you for choosing [facility] !

## 2016-11-17 ENCOUNTER — Ambulatory Visit (HOSPITAL_COMMUNITY)
Admission: RE | Admit: 2016-11-17 | Discharge: 2016-11-17 | Disposition: A | Discharge status: Home / Self Care | Payer: BLUE CROSS/BLUE SHIELD | Source: Ambulatory Visit | Attending: Cardiovascular Disease | Admitting: Cardiovascular Disease

## 2016-11-17 DIAGNOSIS — I309 Acute pericarditis, unspecified: Secondary | ICD-10-CM | POA: Insufficient documentation

## 2016-11-17 DIAGNOSIS — R079 Chest pain, unspecified: Secondary | ICD-10-CM | POA: Diagnosis present

## 2016-11-17 LAB — EXERCISE TOLERANCE TEST
CHL CUP RESTING HR STRESS: 69 {beats}/min
CHL RATE OF PERCEIVED EXERTION: 13
Estimated workload: 10.1 METS
Exercise duration (min): 7 min
Exercise duration (sec): 54 s
MPHR: 185 {beats}/min
Peak HR: 164 {beats}/min
Percent HR: 88 %

## 2016-11-17 NOTE — Telephone Encounter (Signed)
-----   Message from Wendall StadePeter C Nishan, MD sent at 11/17/2016 10:57 AM EDT ----- Normal echo with good EF and no significant valvular heart disease

## 2016-11-17 NOTE — Telephone Encounter (Signed)
Called pt. No answer, left message for pt to return call.  

## 2016-11-17 NOTE — Progress Notes (Signed)
*  PRELIMINARY RESULTS* Echocardiogram 2D Echocardiogram has been performed.  Jeryl Columbialliott, Zaeda Mcferran 11/17/2016, 10:20 AM

## 2017-04-27 ENCOUNTER — Encounter: Payer: Self-pay | Admitting: Family Medicine

## 2019-02-17 ENCOUNTER — Other Ambulatory Visit: Payer: Self-pay | Admitting: *Deleted

## 2019-02-17 DIAGNOSIS — Z20822 Contact with and (suspected) exposure to covid-19: Secondary | ICD-10-CM

## 2019-02-19 LAB — NOVEL CORONAVIRUS, NAA: SARS-CoV-2, NAA: NOT DETECTED

## 2019-06-13 ENCOUNTER — Encounter: Payer: Self-pay | Admitting: Family Medicine

## 2020-01-26 ENCOUNTER — Ambulatory Visit (HOSPITAL_COMMUNITY)
Admission: RE | Admit: 2020-01-26 | Discharge: 2020-01-26 | Disposition: A | Discharge status: Home / Self Care | Payer: BC Managed Care – PPO | Source: Ambulatory Visit | Attending: Family Medicine | Admitting: Family Medicine

## 2020-01-26 ENCOUNTER — Other Ambulatory Visit: Payer: Self-pay | Admitting: *Deleted

## 2020-01-26 ENCOUNTER — Ambulatory Visit (INDEPENDENT_AMBULATORY_CARE_PROVIDER_SITE_OTHER): Payer: BC Managed Care – PPO | Admitting: Family Medicine

## 2020-01-26 ENCOUNTER — Other Ambulatory Visit (HOSPITAL_COMMUNITY)
Admission: RE | Admit: 2020-01-26 | Discharge: 2020-01-26 | Disposition: A | Discharge status: Home / Self Care | Payer: BC Managed Care – PPO | Source: Ambulatory Visit | Attending: Family Medicine | Admitting: Family Medicine

## 2020-01-26 ENCOUNTER — Other Ambulatory Visit: Payer: Self-pay

## 2020-01-26 ENCOUNTER — Ambulatory Visit: Payer: BC Managed Care – PPO | Admitting: Family Medicine

## 2020-01-26 DIAGNOSIS — R059 Cough, unspecified: Secondary | ICD-10-CM

## 2020-01-26 DIAGNOSIS — R05 Cough: Secondary | ICD-10-CM

## 2020-01-26 DIAGNOSIS — R079 Chest pain, unspecified: Secondary | ICD-10-CM

## 2020-01-26 DIAGNOSIS — R899 Unspecified abnormal finding in specimens from other organs, systems and tissues: Secondary | ICD-10-CM

## 2020-01-26 LAB — TROPONIN I (HIGH SENSITIVITY): Troponin I (High Sensitivity): 2 ng/L (ref <18)

## 2020-01-26 LAB — CBC WITH DIFFERENTIAL/PLATELET
Abs Immature Granulocytes: 0.02 10*3/uL (ref 0.00–0.07)
Basophils Absolute: 0 10*3/uL (ref 0.0–0.1)
Basophils Relative: 0 %
Eosinophils Absolute: 0.1 10*3/uL (ref 0.0–0.5)
Eosinophils Relative: 2 %
HCT: 42.9 % (ref 36.0–46.0)
Hemoglobin: 13.7 g/dL (ref 12.0–15.0)
Immature Granulocytes: 0 %
Lymphocytes Relative: 19 %
Lymphs Abs: 1.1 10*3/uL (ref 0.7–4.0)
MCH: 31.7 pg (ref 26.0–34.0)
MCHC: 31.9 g/dL (ref 30.0–36.0)
MCV: 99.3 fL (ref 80.0–100.0)
Monocytes Absolute: 0.3 10*3/uL (ref 0.1–1.0)
Monocytes Relative: 5 %
Neutro Abs: 4.4 10*3/uL (ref 1.7–7.7)
Neutrophils Relative %: 74 %
Platelets: 97 10*3/uL — ABNORMAL LOW (ref 150–400)
RBC: 4.32 MIL/uL (ref 3.87–5.11)
RDW: 13.1 % (ref 11.5–15.5)
WBC: 5.9 10*3/uL (ref 4.0–10.5)
nRBC: 0 % (ref 0.0–0.2)

## 2020-01-26 LAB — CREATININE, SERUM
Creatinine, Ser: 0.78 mg/dL (ref 0.44–1.00)
GFR calc Af Amer: >60 mL/min (ref >60)
GFR calc non Af Amer: >60 mL/min (ref >60)

## 2020-01-26 LAB — D-DIMER, QUANTITATIVE: D-Dimer, Quant: <0.27 ug/mL-FEU (ref 0.00–0.50)

## 2020-01-26 MED ORDER — INDOMETHACIN 25 MG PO CAPS: ORAL_CAPSULE | ORAL | 0 refills | Status: DC

## 2020-01-26 NOTE — Progress Notes (Signed)
   Subjective:    Patient ID: Tina Roth, female    DOB: [birth year], 39 y.o.   MRN: [id number]  Cough This is a new problem. Episode onset: week and a half  The cough is productive of sputum. Associated symptoms include nasal congestion and a sore throat. Associated symptoms comments: Tightness in chest x 2 days.. Treatments tried: otc cold and flu.  Had Covid test Monday and tested negative.   Patient relates sore throat congestion coughing denies high fever chills sweats does relate feeling fatigued tired rundown relates a lot of chest congestion tightness in the chest with aching in the chest felt severe this morning was thinking about going to the ER but did not Review of Systems  HENT: Positive for sore throat.   Respiratory: Positive for cough.    O2 saturation 98%    Objective:   Physical Exam  Not respiratory distress respiratory rate normal no crackles no wheezes heart regular pulse normal extremities no edema skin warm dry      Assessment & Plan:  Viral syndrome Cannot rule out the possibility of underlying myocarditis or even blood clot recommend troponin recommend D-dimer also recommend chest x-ray.  Could just be inflammatory changes with a prolonged virus Potentially will use anti-inflammatories possibly antibiotic Await the test results currently

## 2020-05-09 ENCOUNTER — Ambulatory Visit (INDEPENDENT_AMBULATORY_CARE_PROVIDER_SITE_OTHER): Payer: BC Managed Care – PPO | Admitting: Family Medicine

## 2020-05-09 ENCOUNTER — Encounter: Payer: Self-pay | Admitting: Family Medicine

## 2020-05-09 ENCOUNTER — Other Ambulatory Visit: Payer: Self-pay

## 2020-05-09 VITALS — BP 138/98 | HR 99 | Temp 97.5°F | Wt 210.0 lb

## 2020-05-09 DIAGNOSIS — F419 Anxiety disorder, unspecified: Secondary | ICD-10-CM

## 2020-05-09 DIAGNOSIS — F430 Acute stress reaction: Secondary | ICD-10-CM

## 2020-05-09 DIAGNOSIS — F410 Panic disorder [episodic paroxysmal anxiety] without agoraphobia: Secondary | ICD-10-CM

## 2020-05-09 DIAGNOSIS — F32A Depression, unspecified: Secondary | ICD-10-CM | POA: Diagnosis not present

## 2020-05-09 MED ORDER — SERTRALINE HCL 50 MG PO TABS: 50.0000 mg | ORAL_TABLET | Freq: Every day | ORAL | 1 refills | Status: DC

## 2020-05-09 NOTE — Patient Instructions (Signed)
[facility] [address] [place], [place]  Open 24/7 No appointment necessary         Major Depressive Disorder, Adult Major depressive disorder (MDD) is a mental health condition. MDD often makes you feel sad, hopeless, or helpless. MDD can also cause symptoms in your body. MDD can affect your:  Work.  School.  Relationships.  Other normal activities. MDD can range from mild to very bad. It may occur once (single episode MDD). It can also occur many times (recurrent MDD). The main symptoms of MDD often include:  Feeling sad, depressed, or irritable most of the time.  Loss of interest. MDD symptoms also include:  Sleeping too much or too little.  Eating too much or too little.  A change in your weight.  Feeling tired (fatigue) or having low energy.  Feeling worthless.  Feeling guilty.  Trouble making decisions.  Trouble thinking clearly.  Thoughts of suicide or harming others.  Feeling weak.  Feeling agitated.  Keeping yourself from being around other people (isolation). Follow these instructions at home: Activity  Do these things as told by your doctor: ? Go back to your normal activities. ? Exercise regularly. ? Spend time outdoors. Alcohol  Talk with your doctor about how alcohol can affect your antidepressant medicines.  Do not drink alcohol. Or, limit how much alcohol you drink. ? This means no more than 1 drink a day for nonpregnant women and 2 drinks a day for men. One drink equals one of these:  12 oz of beer.  5 oz of wine.  1 oz of hard liquor. General instructions  Take over-the-counter and prescription medicines only as told by your doctor.  Eat a healthy diet.  Get plenty of sleep.  Find activities that you enjoy. Make time to do them.  Think about joining a support group. Your doctor may be able to suggest a group for you.  Keep all follow-up visits as told by your doctor. This is  important. Where to find more information:  The [employer] on Mental Illness: ? www.nami.org  [place] [employer] of Mental Health: ? http://www.maynard.net/  National Suicide Prevention Lifeline: ? 219-771-9005. This is free, 24-hour help. Contact a doctor if:  Your symptoms get worse.  You have new symptoms. Get help right away if:  You self-harm.  You see, hear, taste, smell, or feel things that are not present (hallucinate). If you ever feel like you may hurt yourself or others, or have thoughts about taking your own life, get help right away. You can go to your nearest emergency department or call:  Your local emergency services (911 in the [place]  A suicide crisis helpline, such as the National Suicide Prevention Lifeline: ? (828)175-3386. This is open 24 hours a day. This information is not intended to replace advice given to you by your health care provider. Make sure you discuss any questions you have with your health care provider. Document Revised: 04/10/2017 Document Reviewed: 01/13/2016 Elsevier Patient Education  2020 [employer].

## 2020-05-09 NOTE — Progress Notes (Signed)
Patient ID: Tina Roth, female    DOB: 1980-07-11, 39 y.o.   MRN: [id number]   Chief Complaint  Patient presents with  . Depression   Subjective:  CC: depression/panic attacks  This is a new problem.  Presents today for depression and panic attacks.  Reports that her dad passed away last 2022-05-25, and her brother passed away 3 weeks ago.  She is really struggling with this loss.  She reports that she can be driving along and started having a panic attack.  During her panic attack she has chest pain.  In September 2021 she thought she was having heart attack went to the hospital and had a thorough cardiac work-up.  She believed it was a panic attack at that time.  She has seen cardiology in 2018.  She reports that she is moving to [place] soon her husband's family is there, and she feels that she needs a fresh start.  She is interested in going on medication today.  She denies thoughts of harming herself.   discuss anxiety and depression.    Medical History Tina Roth has a past medical history of Abnormal Pap smear, Anxiety, and Depression.   Outpatient Encounter Medications as of 05/09/2020  Medication Sig  . acetaminophen (TYLENOL) 500 MG tablet Take 500 mg by mouth every 6 (six) hours as needed.  . sertraline (ZOLOFT) 50 MG tablet Take 1 tablet (50 mg total) by mouth daily.  . [DISCONTINUED] ibuprofen (ADVIL,MOTRIN) 200 MG tablet Take 200 mg by mouth every 6 (six) hours as needed.  . [DISCONTINUED] indomethacin (INDOCIN) 25 MG capsule Take 1 tablet 3 x daily PRN chest discomfort   No facility-administered encounter medications on file as of 05/09/2020.     Review of Systems  Constitutional: Negative for chills and fever.  Respiratory: Negative for shortness of breath.   Cardiovascular: Negative for chest pain.  Gastrointestinal: Negative for abdominal pain.  Psychiatric/Behavioral: Negative for self-injury and suicidal ideas.             Vitals BP (!) 138/98   Pulse  99   Temp (!) 97.5 F (36.4 C)   Wt 210 lb (95.3 kg)   SpO2 100%   BMI 31.93 kg/m   Objective:   Physical Exam Vitals reviewed.  Constitutional:      Appearance: Normal appearance.  Cardiovascular:     Rate and Rhythm: Normal rate and regular rhythm.     Heart sounds: Normal heart sounds.  Pulmonary:     Effort: Pulmonary effort is normal.     Breath sounds: Normal breath sounds.  Skin:    General: Skin is warm and dry.  Neurological:     General: No focal deficit present.     Mental Status: She is alert.  Psychiatric:        Behavior: Behavior normal.     Comments: PHQ-9: 9 GAD 7: 8      Assessment and Plan   1. Panic attack as reaction to stress - TSH - sertraline (ZOLOFT) 50 MG tablet; Take 1 tablet (50 mg total) by mouth daily.  Dispense: 30 tablet; Refill: 1  2. Anxiety and depression - sertraline (ZOLOFT) 50 MG tablet; Take 1 tablet (50 mg total) by mouth daily.  Dispense: 30 tablet; Refill: 1   She has been on Zoloft in the past, wishes to restart that today.  Will test TSH just to be sure that something is not going on with her thyroid that is mimicking her panic attacks.  Discussed counseling, she says she will think about it.  Explained how medication and counseling work well together.  She will be moving to [place] soon, unsure how long that will take, she will need to find a primary care provider in [place] to continue this medication after her move.  Agrees with plan of care discussed today. Understands warning signs to seek further care: Chest pain, shortness of breath, any significant change in health, any thoughts of harming oneself or others.  Information given today on the [facility] behavioral health urgent care if needed. Understands to follow-up in 1 month in person, via MyChart if she has already moved to [place].  Reiterated that she will need to find a provider to prescribe this medication, as she would not want to go off of it abruptly.   We can manage this medication until she can find that provider.  Will notify of TSH results once they become available.   Dorena Bodo, FNP-C 05/09/2020

## 2020-06-07 ENCOUNTER — Other Ambulatory Visit: Payer: Self-pay

## 2020-06-07 ENCOUNTER — Encounter: Payer: Self-pay | Admitting: Family Medicine

## 2020-06-07 ENCOUNTER — Ambulatory Visit (INDEPENDENT_AMBULATORY_CARE_PROVIDER_SITE_OTHER): Payer: Self-pay | Admitting: Family Medicine

## 2020-06-07 VITALS — BP 128/80 | HR 95 | Temp 98.5°F | Ht 68.0 in | Wt 209.0 lb

## 2020-06-07 DIAGNOSIS — F1010 Alcohol abuse, uncomplicated: Secondary | ICD-10-CM | POA: Insufficient documentation

## 2020-06-07 DIAGNOSIS — G40909 Epilepsy, unspecified, not intractable, without status epilepticus: Secondary | ICD-10-CM

## 2020-06-07 NOTE — Progress Notes (Signed)
   Patient ID: Tina Roth, female    DOB: 09/14/1980, 40 y.o.   MRN: [id number]   No chief complaint on file.  Subjective:  CC: follow-up for depression/anxiety  This is a new problem.  Presents today for follow-up for depression/anxiety.  Initially seen December 29, sertraline started at that time.  Presents today with her husband in the room, reporting that on 2 different occasions Tina Roth had what appeared to be a seizure.  Reports that her depression and anxiety have improved, her PHQ-9 score today is 11 and her gad 7 score is 11.  She does not have a diagnosis of seizure disorder.    discuss depression. States she had two seizures. One about  2 weeks ago and the 2nd one about one week ago.   Medical History Tina Roth has a past medical history of Abnormal Pap smear, Anxiety, and Depression.   Outpatient Encounter Medications as of 06/07/2020  Medication Sig  . acetaminophen (TYLENOL) 500 MG tablet Take 500 mg by mouth every 6 (six) hours as needed.  . hydrOXYzine (ATARAX/VISTARIL) 25 MG tablet Take 25 mg by mouth every 6 (six) hours.  . [DISCONTINUED] sertraline (ZOLOFT) 50 MG tablet Take 1 tablet (50 mg total) by mouth daily.   No facility-administered encounter medications on file as of 06/07/2020.     Review of Systems  Constitutional: Negative for chills and fever.  Respiratory: Negative for shortness of breath.   Cardiovascular: Negative for chest pain.  Psychiatric/Behavioral: Positive for decreased concentration. Negative for hallucinations, self-injury and suicidal ideas. The patient is nervous/anxious.      Vitals BP 128/80   Pulse 95   Temp 98.5 F (36.9 C)   Ht 5\' 8"  (1.727 m)   Wt 209 lb (94.8 kg)   SpO2 98%   BMI 31.78 kg/m   Objective:   Physical Exam Vitals reviewed.  Constitutional:      Appearance: Normal appearance.  Cardiovascular:     Rate and Rhythm: Normal rate and regular rhythm.     Heart sounds: Normal heart sounds.  Pulmonary:      Effort: Pulmonary effort is normal.     Breath sounds: Normal breath sounds.  Skin:    General: Skin is warm and dry.  Neurological:     General: No focal deficit present.     Mental Status: She is alert.  Psychiatric:        Behavior: Behavior normal.      Assessment and Plan   1. Seizure disorder [facility]) - Ambulatory referral to Neurology  2. Alcohol abuse - Ambulatory referral to Psychology   Due to possible seizure activity, will refer to neurology, she will wean off of sertraline, instructions given.    Upon reviewing the emergency department note, it is noted that she has a dependence on alcohol, drinking 6-7 shots of vodka daily.   Alcohol consumption is a contraindication with sertraline therapy.  Will place order for counseling for alcohol abuse.  Will notify patient of this referral.  Agrees with plan of care discussed today. Understands warning signs to seek further care: chest pain, shortness of breath, any significant change in health.  Understands to follow-up in 1 month, follow-up with neurology, follow-up with psychology for excessive alcohol consumption, grief counseling.   [facility], NP 06/07/2020

## 2020-06-07 NOTE — Patient Instructions (Addendum)
Start to wean sertraline. Start with every other day for 7 days, then every two days for 7 days, then every three days for 7 days, until off.   [facility] [address] [place], [place]  Open 24/7 No appointment necessary        Seizure, Adult A seizure is a sudden burst of abnormal electrical and chemical activity in the brain. Seizures usually last from 30 seconds to 2 minutes.  What are the causes? Common causes of this condition include:  Fever or infection.  Problems that affect the brain. These may include: ? A brain or head injury. ? Bleeding in the brain. ? A brain tumor.  Low levels of blood sugar or salt.  Kidney problems or liver problems.  Conditions that are passed from parent to child (are inherited).  Problems with a substance, such as: ? Having a reaction to a drug or a medicine. ? Stopping the use of a substance all of a sudden (withdrawal).  A stroke.  Disorders that affect how you develop. Sometimes, the cause may not be known.  What increases the risk?  Having someone in your family who has epilepsy. In this condition, seizures happen again and again over time. They have no clear cause.  Having had a tonic-clonic seizure before. This type of seizure causes you to: ? Tighten the muscles of the whole body. ? Lose consciousness.  Having had a head injury or strokes before.  Having had a lack of oxygen at birth. What are the signs or symptoms? There are many types of seizures. The symptoms vary depending on the type of seizure you have. Symptoms during a seizure  Shaking that you cannot control (convulsions) with fast, jerky movements of muscles.  Stiffness of the body.  Breathing problems.  Feeling mixed up (confused).  Staring or not responding to sound or touch.  Head nodding.  Eyes that blink, flutter, or move fast.  Drooling, grunting, or making clicking sounds with your mouth  Losing  control of when you pee or poop. Symptoms before a seizure  Feeling afraid, nervous, or worried.  Feeling like you may vomit.  Feeling like: ? You are moving when you are not. ? Things around you are moving when they are not.  Feeling like you saw or heard something before (dj vu).  Odd tastes or smells.  Changes in how you see. You may see flashing lights or spots. Symptoms after a seizure  Feeling confused.  Feeling sleepy.  Headache.  Sore muscles. How is this treated? If your seizure stops on its own, you will not need treatment. If your seizure lasts longer than 5 minutes, you will normally need treatment. Treatment may include:  Medicines given through an IV tube.  Avoiding things, such as medicines, that are known to cause your seizures.  Medicines to prevent seizures.  A device to prevent or control seizures.  Surgery.  A diet low in carbohydrates and high in fat (ketogenic diet). Follow these instructions at home: Medicines  Take over-the-counter and prescription medicines only as told by your doctor.  Avoid foods or drinks that may keep your medicine from working, such as alcohol. Activity  Follow instructions about driving, swimming, or doing things that would be dangerous if you had another seizure. Wait until your doctor says it is safe for you to do these things.  If you live in the [place] ask your local department of motor vehicles when you can drive.  Get a lot of rest. Teaching others  Teach friends and family what to do when you have a seizure. They should: ? Help you get down to the ground. ? Protect your head and body. ? Loosen any clothing around your neck. ? Turn you on your side. ? Know whether or not you need emergency care. ? Stay with you until you are better.  Also, tell them what not to do if you have a seizure. Tell them: ? They should not hold you down. ? They should not put anything in your mouth.   General  instructions  Avoid anything that gives you seizures.  Keep a seizure diary. Write down: ? What you remember about each seizure. ? What you think caused each seizure.  Keep all follow-up visits. Contact a doctor if:  You have another seizure or seizures. Call the doctor each time you have a seizure.  The pattern of your seizures changes.  You keep having seizures with treatment.  You have symptoms of being sick or having an infection.  You are not able to take your medicine. Get help right away if:  You have any of these problems: ? A seizure that lasts longer than 5 minutes. ? Many seizures in a row and you do not feel better between seizures. ? A seizure that makes it harder to breathe. ? A seizure and you can no longer speak or use part of your body.  You do not wake up right after a seizure.  You get hurt during a seizure.  You feel confused or have pain right after a seizure. These symptoms may be an emergency. Get help right away. Call your local emergency services (911 in the [place]  Do not wait to see if the symptoms will go away.  Do not drive yourself to the hospital. Summary  A seizure is a sudden burst of abnormal electrical and chemical activity in the brain. Seizures normally last from 30 seconds to 2 minutes.  Causes of seizures include illness, injury to the head, low levels of blood sugar or salt, and certain conditions.  Most seizures will stop on their own in less than 5 minutes. Seizures that last longer than 5 minutes are a medical emergency and need treatment right away.  Many medicines are used to treat seizures. Take over-the-counter and prescription medicines only as told by your doctor. This information is not intended to replace advice given to you by your health care provider. Make sure you discuss any questions you have with your health care provider. Document Revised: 11/04/2019 Document Reviewed: 11/04/2019 Elsevier Patient Education   2021 [employer].

## 2020-07-09 ENCOUNTER — Encounter: Payer: Self-pay | Admitting: Family Medicine

## 2021-10-27 IMAGING — DX DG CHEST 2V
2 series · 2 of 2 positions shown · non-contrast
Comparison: 12/20/2018

CLINICAL DATA: Anterior chest pain.

EXAM:
CHEST - 2 VIEW

[chest pa]
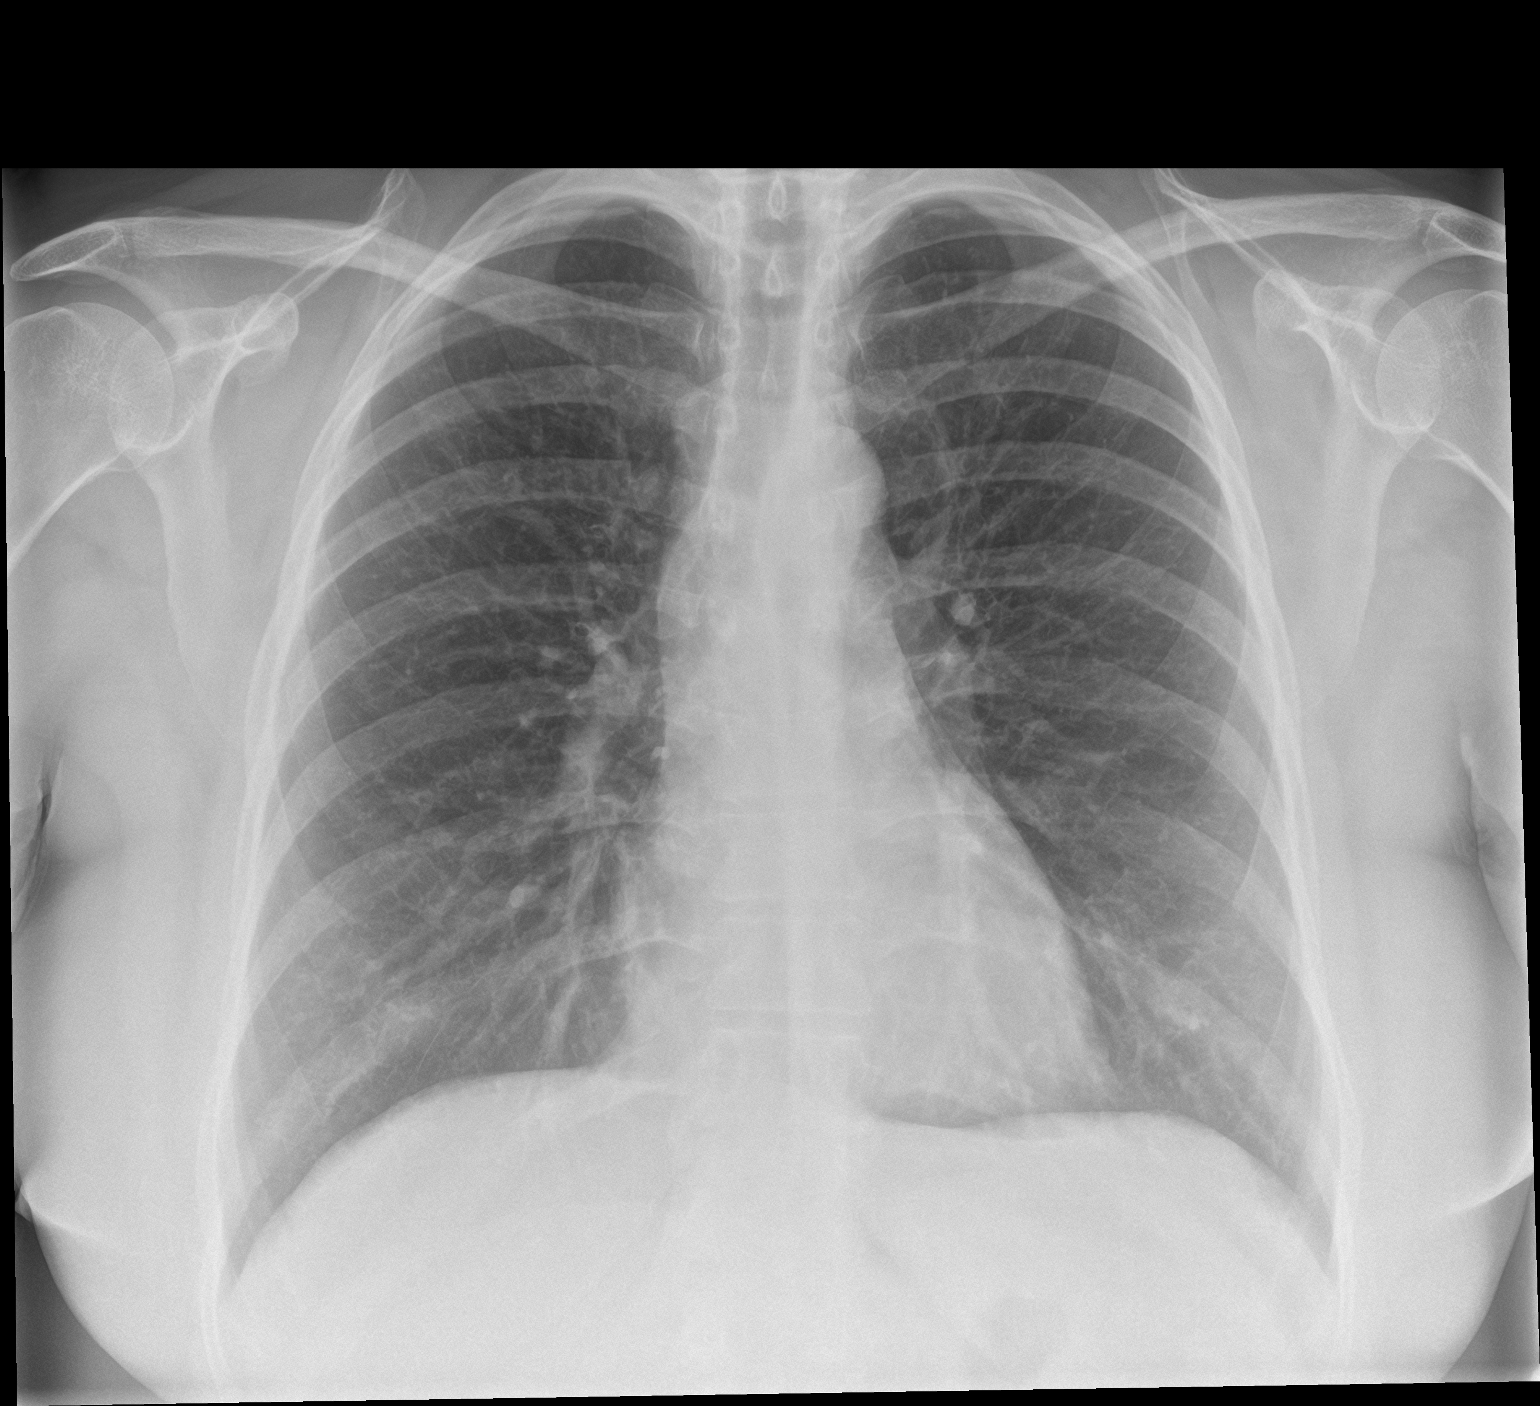

[chest lat]
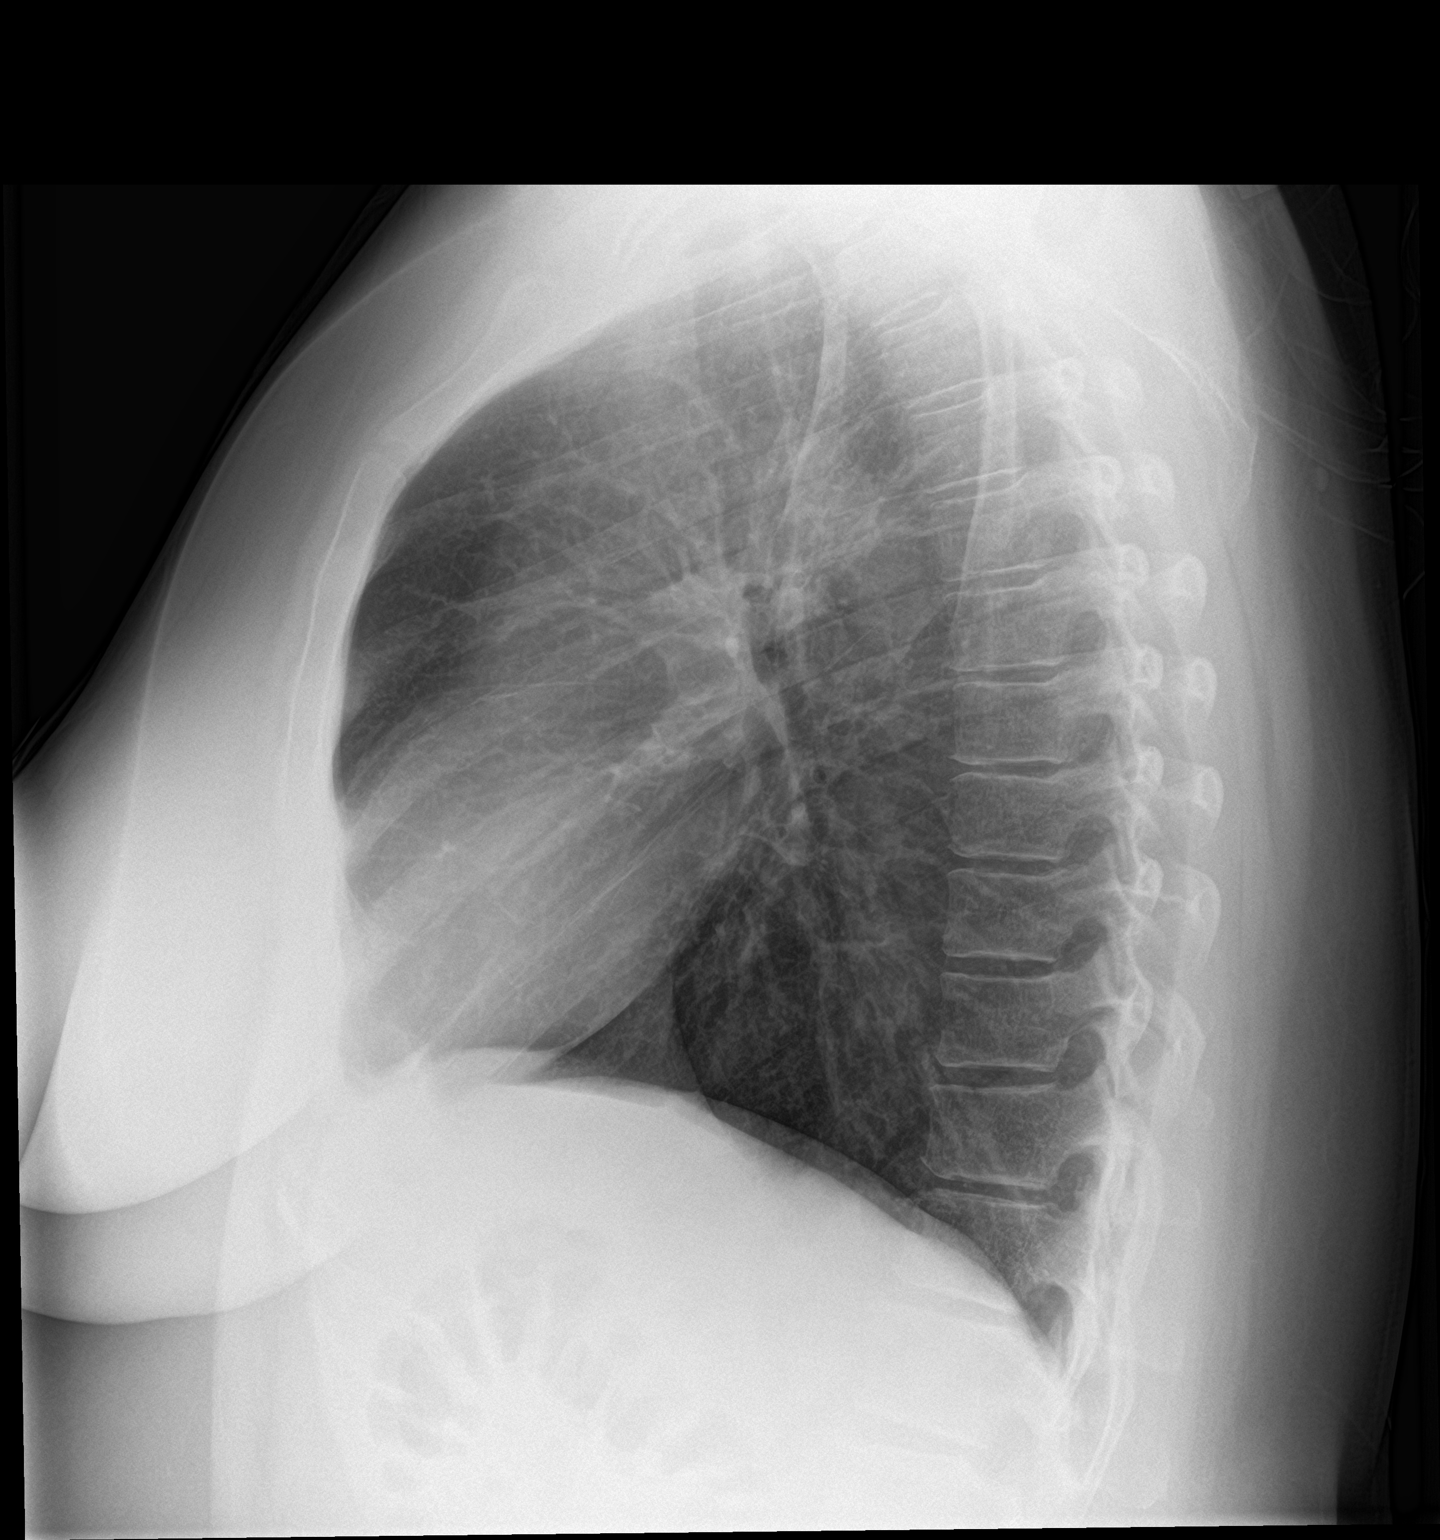

[2 of 2 positions shown; findings below may reference images not displayed]

FINDINGS: The heart size and mediastinal contours are within normal limits.
There is no evidence of pulmonary edema, consolidation,
pneumothorax, nodule or pleural fluid. The visualized skeletal
structures are unremarkable.
IMPRESSION: No active cardiopulmonary disease.

## 2022-12-17 NOTE — Progress Notes (Signed)
This encounter was created in error - please disregard.
